# Patient Record
Sex: Male | Born: 1995 | Race: Black or African American | Hispanic: No | Marital: Single | Smoking: Never smoker
Health system: Southern US, Community
[De-identification: ages and names within clinical notes are randomized; demographics above are authoritative.]

---
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
## 2017-11-18 ENCOUNTER — Ambulatory Visit (HOSPITAL_COMMUNITY)
Admission: RE | Admit: 2017-11-18 | Discharge: 2017-11-18 | Disposition: A | Discharge status: Home / Self Care | Payer: BC Managed Care – PPO | Source: Ambulatory Visit | Attending: Family Medicine | Admitting: Family Medicine

## 2017-11-18 ENCOUNTER — Other Ambulatory Visit (HOSPITAL_COMMUNITY): Payer: Self-pay

## 2017-11-18 ENCOUNTER — Other Ambulatory Visit: Payer: Self-pay | Admitting: Family Medicine

## 2017-11-18 ENCOUNTER — Emergency Department (HOSPITAL_COMMUNITY)
Admission: EM | Admit: 2017-11-18 | Discharge: 2017-11-18 | Discharge status: Absent without leave | Payer: BLUE CROSS/BLUE SHIELD

## 2017-11-18 ENCOUNTER — Other Ambulatory Visit (HOSPITAL_COMMUNITY): Payer: Self-pay | Admitting: Family Medicine

## 2017-11-18 DIAGNOSIS — S022XXA Fracture of nasal bones, initial encounter for closed fracture: Secondary | ICD-10-CM | POA: Diagnosis not present

## 2017-11-18 DIAGNOSIS — S0121XA Laceration without foreign body of nose, initial encounter: Secondary | ICD-10-CM | POA: Insufficient documentation

## 2017-11-18 DIAGNOSIS — T148XXA Other injury of unspecified body region, initial encounter: Secondary | ICD-10-CM | POA: Insufficient documentation

## 2017-11-18 DIAGNOSIS — R52 Pain, unspecified: Secondary | ICD-10-CM

## 2017-11-20 ENCOUNTER — Other Ambulatory Visit (HOSPITAL_COMMUNITY): Payer: Self-pay

## 2018-01-20 ENCOUNTER — Encounter (HOSPITAL_COMMUNITY): Payer: Self-pay | Admitting: Emergency Medicine

## 2018-01-20 ENCOUNTER — Ambulatory Visit (HOSPITAL_COMMUNITY)
Admission: EM | Admit: 2018-01-20 | Discharge: 2018-01-20 | Disposition: A | Discharge status: Home / Self Care | Payer: BC Managed Care – PPO | Attending: Internal Medicine | Admitting: Internal Medicine

## 2018-01-20 DIAGNOSIS — R350 Frequency of micturition: Secondary | ICD-10-CM | POA: Diagnosis not present

## 2018-01-20 DIAGNOSIS — R3129 Other microscopic hematuria: Secondary | ICD-10-CM | POA: Diagnosis not present

## 2018-01-20 DIAGNOSIS — R81 Glycosuria: Secondary | ICD-10-CM

## 2018-01-20 DIAGNOSIS — J069 Acute upper respiratory infection, unspecified: Secondary | ICD-10-CM

## 2018-01-20 DIAGNOSIS — R319 Hematuria, unspecified: Secondary | ICD-10-CM | POA: Diagnosis present

## 2018-01-20 LAB — POCT URINALYSIS DIP (DEVICE)
BILIRUBIN URINE: NEGATIVE
GLUCOSE, UA: 250 mg/dL — AB
KETONES UR: NEGATIVE mg/dL
Leukocytes, UA: NEGATIVE
Nitrite: NEGATIVE
PROTEIN: NEGATIVE mg/dL
Specific Gravity, Urine: 1.015 (ref 1.005–1.030)
Urobilinogen, UA: 0.2 mg/dL (ref 0.0–1.0)
pH: 6.5 (ref 5.0–8.0)

## 2018-01-20 LAB — GLUCOSE, CAPILLARY: GLUCOSE-CAPILLARY: 89 mg/dL (ref 70–99)

## 2018-01-20 LAB — RAPID HIV SCREEN (HIV 1/2 AB+AG)
HIV 1/2 Antibodies: NONREACTIVE
HIV-1 P24 ANTIGEN - HIV24: NONREACTIVE

## 2018-01-20 MED ORDER — NITROFURANTOIN MONOHYD MACRO 100 MG PO CAPS: 100.0000 mg | ORAL_CAPSULE | Freq: Two times a day (BID) | ORAL | 0 refills | Status: AC

## 2018-01-20 NOTE — Discharge Instructions (Addendum)
Your exam is not suspicious for kidney stone so could be you have a bladder infection causing the blood  I am sending the urine for a culture to check if there is bacterial grown and we will call you when the results come back next week. In the mean time I am placing you on an antibiotic for bladder infections.  If you continue having blood after 48 hours of starting the antibiotic, or develops back pain on one side only with fever, then go to the Er, you are going to need more test than what we can do in urgent care.   Your urine also shows little sugar but your blood sugar level is 89 which is normal, follow up with your family Dr for re-check.

## 2018-01-20 NOTE — ED Triage Notes (Signed)
Pt c/o blood in his urine x2 days. With urinary frequency.

## 2018-01-20 NOTE — ED Provider Notes (Addendum)
[facility]    CSN: [id number] Arrival date & time: 01/20/18  1734     History   Chief Complaint Chief Complaint  Patient presents with  . Hematuria    HPI Eddie [name] is a 22 y.o. male.   Pt saw blood in his urine 2 nights ago several times, the next am a few more times, then was fine til that night again and saw it a few more times. The blood he saw is small clots. He has also been having frequency and chills. The day before the hematuria. Has been having central lumbar aches and body aches all over. No HA. Is sexually active with male partner x 1.5 years and denies any recent new partners. Denies rough sex. Denies penile discharge.  MGF had renal stones.     History reviewed. No pertinent past medical history.  There are no active problems to display for this patient.  Past Surgical History:  Procedure Laterality Date  . INCISION AND DRAINAGE OF WOUND Right 06/26/2016   Procedure: EXPLORATION OF RIGHT HAND WOUND AND OPEN REDUCTION AND INTERNAL FIXATION OF THIRD METACARPEL;  Surgeon: Knute Neu, MD;  Location: [facility] OR;  Service: Plastics;  Laterality: Right;       Home Medications    Prior to Admission medications   Medication Sig Start Date End Date Taking? Authorizing Provider  nitrofurantoin, macrocrystal-monohydrate, (MACROBID) 100 MG capsule Take 1 capsule (100 mg total) by mouth 2 (two) times daily. 01/20/18   Rodriguez-Southworth, Nettie Elm, PA-C    Family History Family History  Problem Relation Age of Onset  . Healthy Mother     Social History Social History   Tobacco Use  . Smoking status: Never Smoker  . Smokeless tobacco: Never Used  Substance Use Topics  . Alcohol use: No  . Drug use: No     Allergies   Patient has no known allergies.   Review of Systems Review of Systems  Constitutional: Positive for chills and fever. Negative for appetite change and diaphoresis.  HENT: Positive for postnasal drip and sore throat.  Negative for trouble swallowing.   Respiratory: Negative for cough and shortness of breath.   Gastrointestinal: Negative for abdominal pain.  Genitourinary: Positive for hematuria. Negative for difficulty urinating, discharge, dysuria, flank pain, frequency, genital sores, penile pain and urgency.  Musculoskeletal: Positive for back pain and myalgias.       See HPI  Skin: Negative for rash.     Physical Exam Triage Vital Signs ED Triage Vitals  Enc Vitals Group     BP 01/20/18 1802 135/64     Pulse Rate 01/20/18 1801 60     Resp 01/20/18 1801 16     Temp 01/20/18 1801 98.6 F (37 C)     Temp src --      SpO2 01/20/18 1801 100 %     Weight --      Height --      Head Circumference --      Peak Flow --      Pain Score 01/20/18 1802 0     Pain Loc --      Pain Edu? --      Excl. in GC? --    No data found.  Updated Vital Signs BP 135/64   Pulse 60   Temp 98.6 F (37 C)   Resp 16   SpO2 100%   Visual Acuity Right Eye Distance:   Left Eye Distance:   Bilateral Distance:  Right Eye Near:   Left Eye Near:    Bilateral Near:     Physical Exam  Constitutional: He is oriented to person, place, and time. He appears well-developed and well-nourished. No distress.  HENT:  Head: Normocephalic.  Right Ear: External ear normal.  Left Ear: External ear normal.  Nose: Nose normal.  Mouth/Throat: Oropharynx is clear and moist. No oropharyngeal exudate.  Eyes: Conjunctivae and EOM are normal. Right eye exhibits no discharge. Left eye exhibits no discharge. No scleral icterus.  Neck: Neck supple. No tracheal deviation present. No thyromegaly present.  Cardiovascular: Normal rate, regular rhythm and normal heart sounds.  No murmur heard. Pulmonary/Chest: Effort normal and breath sounds normal. No respiratory distress. He has no wheezes.  Abdominal: Soft. Bowel sounds are normal. He exhibits no distension and no mass. There is no tenderness. There is no rebound and no  guarding.  NO CVA TENDERNESS  Musculoskeletal: Normal range of motion.  Mild tenderness on central lumbar region  Lymphadenopathy:    He has no cervical adenopathy.  Neurological: He is alert and oriented to person, place, and time.  Skin: Skin is warm and dry. He is not diaphoretic.  Psychiatric: He has a normal mood and affect. His behavior is normal. Judgment and thought content normal.  Nursing note and vitals reviewed.    UC Treatments / Results  Labs (all labs ordered are listed, but only abnormal results are displayed) Labs Reviewed  POCT URINALYSIS DIP (DEVICE) - Abnormal; Notable for the following components:      Result Value   Glucose, UA 250 (*)    Hgb urine dipstick TRACE (*)    All other components within normal limits  URINE CULTURE  GLUCOSE, CAPILLARY  HIV ANTIBODY (ROUTINE TESTING W REFLEX)  URINE CYTOLOGY ANCILLARY ONLY  Blood glucose was 89.    Medications Ordered in UC Medications - No data to display  Initial Impression / Assessment and Plan / UC Course  I have reviewed the triage vital signs and the nursing notes. Possible hemorrhagic cystitis. He was placed on Macrobid for 7 days as noted. Urine sent for a urine culture and STD test were also ordered. We will inform him of his results when back.  Pertinent labs results that were available during my care of the patient were reviewed by me and considered in my medical decision making (see chart for details). UA showed trace blood and positive glucose.     Final Clinical Impressions(s) / UC Diagnoses   Final diagnoses:  Other microscopic hematuria   Discharge Instructions   None    ED Prescriptions    Medication Sig Dispense Auth. Provider   nitrofurantoin, macrocrystal-monohydrate, (MACROBID) 100 MG capsule Take 1 capsule (100 mg total) by mouth 2 (two) times daily. 14 capsule Rodriguez-Southworth, Nettie Elm, PA-C     Controlled Substance Prescriptions [place] Controlled Substance Registry  consulted?    Garey Ham, PA-C 01/20/18 1905    Rodriguez-SouthworthNettie Elm, PA-C 01/20/18 1906

## 2018-01-22 LAB — URINE CYTOLOGY ANCILLARY ONLY
Chlamydia: NEGATIVE
NEISSERIA GONORRHEA: NEGATIVE
TRICH (WINDOWPATH): NEGATIVE

## 2018-01-22 LAB — URINE CULTURE: Culture: NO GROWTH

## 2018-02-05 ENCOUNTER — Ambulatory Visit (INDEPENDENT_AMBULATORY_CARE_PROVIDER_SITE_OTHER): Payer: Self-pay | Admitting: Orthopedic Surgery

## 2018-02-05 DIAGNOSIS — M25561 Pain in right knee: Secondary | ICD-10-CM

## 2018-02-12 ENCOUNTER — Encounter (INDEPENDENT_AMBULATORY_CARE_PROVIDER_SITE_OTHER): Payer: Self-pay | Admitting: Orthopedic Surgery
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
